# Patient Record
Sex: Female | Born: 1945 | Race: White | Hispanic: No | Marital: Married | Smoking: Never smoker
Health system: Southern US, Community
[De-identification: ages and names within clinical notes are randomized; demographics above are authoritative.]

## PROBLEM LIST (undated history)

## (undated) DIAGNOSIS — K219 Gastro-esophageal reflux disease without esophagitis: Secondary | ICD-10-CM

## (undated) HISTORY — DX: Gastro-esophageal reflux disease without esophagitis: K21.9

---

## 2014-08-19 ENCOUNTER — Encounter (INDEPENDENT_AMBULATORY_CARE_PROVIDER_SITE_OTHER): Payer: Self-pay | Admitting: *Deleted

## 2014-09-10 ENCOUNTER — Ambulatory Visit (INDEPENDENT_AMBULATORY_CARE_PROVIDER_SITE_OTHER): Payer: BLUE CROSS/BLUE SHIELD | Admitting: Internal Medicine

## 2014-09-10 ENCOUNTER — Encounter (INDEPENDENT_AMBULATORY_CARE_PROVIDER_SITE_OTHER): Payer: Self-pay | Admitting: Internal Medicine

## 2014-09-10 VITALS — BP 132/79 | HR 72 | Temp 98.4°F | Ht 66.0 in | Wt 201.4 lb

## 2014-09-10 DIAGNOSIS — K5732 Diverticulitis of large intestine without perforation or abscess without bleeding: Secondary | ICD-10-CM | POA: Diagnosis not present

## 2014-09-10 DIAGNOSIS — K219 Gastro-esophageal reflux disease without esophagitis: Secondary | ICD-10-CM

## 2014-09-10 LAB — CBC WITH DIFFERENTIAL/PLATELET
BASOS ABS: 0 10*3/uL (ref 0.0–0.1)
Basophils Relative: 0 % (ref 0–1)
Eosinophils Absolute: 0.1 10*3/uL (ref 0.0–0.7)
Eosinophils Relative: 2 % (ref 0–5)
HEMATOCRIT: 38.5 % (ref 36.0–46.0)
Hemoglobin: 12.5 g/dL (ref 12.0–15.0)
LYMPHS PCT: 31 % (ref 12–46)
Lymphs Abs: 1.8 10*3/uL (ref 0.7–4.0)
MCH: 26.4 pg (ref 26.0–34.0)
MCHC: 32.5 g/dL (ref 30.0–36.0)
MCV: 81.4 fL (ref 78.0–100.0)
MONO ABS: 0.4 10*3/uL (ref 0.1–1.0)
MPV: 11 fL (ref 8.6–12.4)
Monocytes Relative: 7 % (ref 3–12)
Neutro Abs: 3.5 10*3/uL (ref 1.7–7.7)
Neutrophils Relative %: 60 % (ref 43–77)
Platelets: 208 10*3/uL (ref 150–400)
RBC: 4.73 MIL/uL (ref 3.87–5.11)
RDW: 13.7 % (ref 11.5–15.5)
WBC: 5.9 10*3/uL (ref 4.0–10.5)

## 2014-09-10 LAB — HEPATIC FUNCTION PANEL
ALBUMIN: 3.9 g/dL (ref 3.5–5.2)
ALK PHOS: 91 U/L (ref 39–117)
ALT: 13 U/L (ref 0–35)
AST: 15 U/L (ref 0–37)
Bilirubin, Direct: 0.1 mg/dL (ref 0.0–0.3)
Indirect Bilirubin: 0.5 mg/dL (ref 0.2–1.2)
TOTAL PROTEIN: 7.4 g/dL (ref 6.0–8.3)
Total Bilirubin: 0.6 mg/dL (ref 0.2–1.2)

## 2014-09-10 NOTE — Patient Instructions (Signed)
Labs. OV in 1 yr. 

## 2014-09-10 NOTE — Progress Notes (Addendum)
   Subjective:    Patient ID: Valerie Buck, female    DOB: 05-27-1945, 69 y.o.   MRN: [id number]  HPI Referred to our office by Dr. Philipp Deputy of Internal Medicine Assoicates.Marland Kitchen  Hx of GERD.  She tells me she is doing okay. She sometimes on her left side it does not feel right.  She says she had a CT and was normal. She says she has had this odd feeling for over a year. Sometimes she will have the pain on movement. She rates the pain 3-4. The pain comes and goes.  Her appetite is good. She has not lost any weight.  She usually has a BM daily. Sometimes she will have 2-3 stools a day. Acid reflux controlled with the Nexium.    Last EGD in 2011 show mild gastritis.  Last colonoscopy in 2014 and she says was normal.  09/11/2013 Sigmoid Dr. West Carbo: Sigmoid diverticulosis. Erythema edema in lower sigmoid probably resolving diverticulitis but possibly recurrent ischemic bowel. Biopsied.  01/2014 ALT 118, AST 22, Direct bili 0.8, H and H 13.1 and 39.7 Review of Systems Married. One child with DJD, fibromyalgia.     Past Medical History  Diagnosis Date  . GERD (gastroesophageal reflux disease)     No past surgical history on file.  Allergies  Allergen Reactions  . Ivp Dye [Iodinated Diagnostic Agents]     No current outpatient prescriptions on file prior to visit.   No current facility-administered medications on file prior to visit.     Objective:   Physical Exam Blood pressure 132/79, pulse 72, temperature 98.4 F (36.9 C), height 5\' 6"  (1.676 m), weight 201 lb 6.4 oz (91.354 kg). Alert and oriented. Skin warm and dry. Oral mucosa is moist.   . Sclera anicteric, conjunctivae is pink. Thyroid not enlarged. No cervical lymphadenopathy. Lungs clear. Heart regular rate and rhythm.  Abdomen is soft. Bowel sounds are positive. No hepatomegaly. No abdominal masses felt. No tenderness.  No edema to lower extremities.          Assessment & Plan:  GERD controlled at this time. LUQ  tenderness for over a year. CT scan per patient was normal. Possibly MS in nature. OV in 1 yr. CBC, Hepatic function today. Will CT report and last colonoscopy from her GI office.

## 2014-09-11 ENCOUNTER — Encounter (INDEPENDENT_AMBULATORY_CARE_PROVIDER_SITE_OTHER): Payer: Self-pay

## 2014-09-16 ENCOUNTER — Encounter (INDEPENDENT_AMBULATORY_CARE_PROVIDER_SITE_OTHER): Payer: Self-pay

## 2015-05-21 ENCOUNTER — Encounter (INDEPENDENT_AMBULATORY_CARE_PROVIDER_SITE_OTHER): Payer: Self-pay | Admitting: Internal Medicine

## 2015-09-10 ENCOUNTER — Ambulatory Visit (INDEPENDENT_AMBULATORY_CARE_PROVIDER_SITE_OTHER): Payer: BLUE CROSS/BLUE SHIELD | Admitting: Internal Medicine

## 2018-11-28 ENCOUNTER — Other Ambulatory Visit: Payer: Self-pay

## 2018-11-28 ENCOUNTER — Encounter (HOSPITAL_COMMUNITY): Payer: Self-pay | Admitting: Emergency Medicine

## 2018-11-28 ENCOUNTER — Emergency Department (HOSPITAL_COMMUNITY)
Admission: EM | Admit: 2018-11-28 | Discharge: 2018-11-29 | Disposition: A | Discharge status: Home / Self Care | Payer: BC Managed Care – PPO | Attending: Emergency Medicine | Admitting: Emergency Medicine

## 2018-11-28 ENCOUNTER — Emergency Department (HOSPITAL_COMMUNITY): Payer: BC Managed Care – PPO

## 2018-11-28 DIAGNOSIS — Z79899 Other long term (current) drug therapy: Secondary | ICD-10-CM | POA: Diagnosis not present

## 2018-11-28 DIAGNOSIS — K625 Hemorrhage of anus and rectum: Secondary | ICD-10-CM | POA: Diagnosis present

## 2018-11-28 DIAGNOSIS — K529 Noninfective gastroenteritis and colitis, unspecified: Secondary | ICD-10-CM | POA: Diagnosis not present

## 2018-11-28 LAB — CBC WITH DIFFERENTIAL/PLATELET
Abs Immature Granulocytes: 0.05 10*3/uL (ref 0.00–0.07)
Basophils Absolute: 0 10*3/uL (ref 0.0–0.1)
Basophils Relative: 0 %
Eosinophils Absolute: 0.2 10*3/uL (ref 0.0–0.5)
Eosinophils Relative: 1 %
HCT: 43.2 % (ref 36.0–46.0)
Hemoglobin: 13.3 g/dL (ref 12.0–15.0)
Immature Granulocytes: 0 %
Lymphocytes Relative: 20 %
Lymphs Abs: 2.7 10*3/uL (ref 0.7–4.0)
MCH: 26.9 pg (ref 26.0–34.0)
MCHC: 30.8 g/dL (ref 30.0–36.0)
MCV: 87.4 fL (ref 80.0–100.0)
Monocytes Absolute: 0.9 10*3/uL (ref 0.1–1.0)
Monocytes Relative: 7 %
Neutro Abs: 9.7 10*3/uL — ABNORMAL HIGH (ref 1.7–7.7)
Neutrophils Relative %: 72 %
Platelets: 211 10*3/uL (ref 150–400)
RBC: 4.94 MIL/uL (ref 3.87–5.11)
RDW: 13.3 % (ref 11.5–15.5)
WBC: 13.6 10*3/uL — ABNORMAL HIGH (ref 4.0–10.5)
nRBC: 0 % (ref 0.0–0.2)

## 2018-11-28 LAB — COMPREHENSIVE METABOLIC PANEL
ALT: 21 U/L (ref 0–44)
AST: 17 U/L (ref 15–41)
Albumin: 3.6 g/dL (ref 3.5–5.0)
Alkaline Phosphatase: 98 U/L (ref 38–126)
Anion gap: 7 (ref 5–15)
BUN: 12 mg/dL (ref 8–23)
CO2: 24 mmol/L (ref 22–32)
Calcium: 8.6 mg/dL — ABNORMAL LOW (ref 8.9–10.3)
Chloride: 107 mmol/L (ref 98–111)
Creatinine, Ser: 1.06 mg/dL — ABNORMAL HIGH (ref 0.44–1.00)
GFR calc Af Amer: >60 mL/min (ref >60)
GFR calc non Af Amer: 52 mL/min — ABNORMAL LOW (ref >60)
Glucose, Bld: 162 mg/dL — ABNORMAL HIGH (ref 70–99)
Potassium: 3.8 mmol/L (ref 3.5–5.1)
Sodium: 138 mmol/L (ref 135–145)
Total Bilirubin: 0.6 mg/dL (ref 0.3–1.2)
Total Protein: 7.4 g/dL (ref 6.5–8.1)

## 2018-11-28 LAB — TYPE AND SCREEN
ABO/RH(D): O POS
Antibody Screen: NEGATIVE

## 2018-11-28 LAB — POC OCCULT BLOOD, ED: Fecal Occult Bld: POSITIVE — AB

## 2018-11-28 MED ORDER — SODIUM CHLORIDE 0.9 % IV BOLUS
1000.0000 mL | Freq: Once | INTRAVENOUS | Status: AC
  Administered 2018-11-28: 21:00:00 1000 mL via INTRAVENOUS

## 2018-11-28 MED ORDER — DICYCLOMINE HCL 20 MG PO TABS: ORAL_TABLET | ORAL | 0 refills | Status: DC

## 2018-11-28 MED ORDER — METRONIDAZOLE 500 MG PO TABS: 500.0000 mg | ORAL_TABLET | Freq: Three times a day (TID) | ORAL | 0 refills | Status: DC

## 2018-11-28 MED ORDER — CIPROFLOXACIN HCL 500 MG PO TABS: ORAL_TABLET | ORAL | 0 refills | Status: DC

## 2018-11-28 MED ORDER — HYDROMORPHONE HCL 1 MG/ML IJ SOLN
1.0000 mg | Freq: Once | INTRAMUSCULAR | Status: AC
  Administered 2018-11-28: 21:00:00 1 mg via INTRAVENOUS
  Filled 2018-11-28: qty 1

## 2018-11-28 MED ORDER — CIPROFLOXACIN IN D5W 400 MG/200ML IV SOLN
400.0000 mg | Freq: Once | INTRAVENOUS | Status: AC
  Administered 2018-11-28: 400 mg via INTRAVENOUS
  Filled 2018-11-28: qty 200

## 2018-11-28 MED ORDER — METRONIDAZOLE IN NACL 5-0.79 MG/ML-% IV SOLN
500.0000 mg | Freq: Once | INTRAVENOUS | Status: AC
  Administered 2018-11-28: 500 mg via INTRAVENOUS
  Filled 2018-11-28: qty 100

## 2018-11-28 NOTE — Discharge Instructions (Addendum)
Follow-up with Dr. Laural Golden next week.  Return if bleeding gets severe

## 2018-11-28 NOTE — ED Triage Notes (Signed)
Pt states that she started having diarrhea last night then it went to bright red blood.

## 2018-11-28 NOTE — ED Provider Notes (Signed)
[facility] EMERGENCY DEPARTMENT Provider Note   CSN: [id number] Arrival date & time: 11/28/18  1733     History   Chief Complaint Chief Complaint  Patient presents with  . Rectal Bleeding    HPI Valerie Buck is a 73 y.o. female.     Patient complains of abdominal pain and rectal bleeding.  For the last 2 days.  Initially she had diarrhea without blood  The history is provided by the patient. No language interpreter was used.  Rectal Bleeding Quality:  Bright red Amount:  Moderate Timing:  Intermittent Chronicity:  New Context: not anal fissures   Similar prior episodes: no   Relieved by:  Nothing Worsened by:  Nothing Ineffective treatments:  None tried Associated symptoms: abdominal pain     Past Medical History:  Diagnosis Date  . GERD (gastroesophageal reflux disease)     Patient Active Problem List   Diagnosis Date Noted  . GERD (gastroesophageal reflux disease) 09/10/2014  . Diverticulitis of colon 09/10/2014    History reviewed. No pertinent surgical history.   OB History   No obstetric history on file.      Home Medications    Prior to Admission medications   Medication Sig Start Date End Date Taking? Authorizing Provider  diclofenac (VOLTAREN) 75 MG EC tablet Take 75 mg by mouth 2 (two) times daily with a meal. 11/15/18  Yes [provider]  Eluxadoline 100 MG TABS Take 100 mg by mouth daily.    Yes [provider]  esomeprazole (NEXIUM) 20 MG capsule Take 20 mg by mouth daily at 12 noon.   Yes [provider]  sertraline (ZOLOFT) 100 MG tablet Take 100 mg by mouth daily.   Yes [provider]  ciprofloxacin (CIPRO) 500 MG tablet One po bid 11/28/18   Milton Ferguson, MD  dicyclomine (BENTYL) 20 MG tablet Take one every 6 hours if needed for abdominal cramps 11/28/18   Milton Ferguson, MD  metroNIDAZOLE (FLAGYL) 500 MG tablet Take 1 tablet (500 mg total) by mouth 3 (three) times daily. One po bid x 7 days 11/28/18    Milton Ferguson, MD    Family History No family history on file.  Social History Social History   Tobacco Use  . Smoking status: Never Smoker  . Smokeless tobacco: Never Used  Substance Use Topics  . Alcohol use: No    Alcohol/week: 0.0 standard drinks  . Drug use: No     Allergies   Ivp dye [iodinated diagnostic agents]   Review of Systems Review of Systems  Constitutional: Negative for appetite change and fatigue.  HENT: Negative for congestion, ear discharge and sinus pressure.   Eyes: Negative for discharge.  Respiratory: Negative for cough.   Cardiovascular: Negative for chest pain.  Gastrointestinal: Positive for abdominal pain and hematochezia. Negative for diarrhea.       Rectal bleeding  Genitourinary: Negative for frequency and hematuria.  Musculoskeletal: Negative for back pain.  Skin: Negative for rash.  Neurological: Negative for seizures and headaches.  Psychiatric/Behavioral: Negative for hallucinations.     Physical Exam Updated Vital Signs BP 120/71   Pulse 77   Temp 98.5 F (36.9 C) (Oral)   Resp 18   Ht 5\' 5"  (1.651 m)   Wt 79.4 kg   SpO2 97%   BMI 29.12 kg/m   Physical Exam Vitals signs and nursing note reviewed.  Constitutional:      Appearance: She is well-developed.  HENT:     Head:  Normocephalic.     Nose: Nose normal.  Eyes:     General: No scleral icterus.    Conjunctiva/sclera: Conjunctivae normal.  Neck:     Musculoskeletal: Neck supple.     Thyroid: No thyromegaly.  Cardiovascular:     Rate and Rhythm: Normal rate and regular rhythm.     Heart sounds: No murmur. No friction rub. No gallop.   Pulmonary:     Breath sounds: No stridor. No wheezing or rales.  Chest:     Chest wall: No tenderness.  Abdominal:     General: There is no distension.     Tenderness: There is abdominal tenderness. There is no rebound.  Musculoskeletal: Normal range of motion.  Lymphadenopathy:     Cervical: No cervical adenopathy.  Skin:     Findings: No erythema or rash.  Neurological:     Mental Status: She is oriented to person, place, and time.     Motor: No abnormal muscle tone.     Coordination: Coordination normal.  Psychiatric:        Behavior: Behavior normal.      ED Treatments / Results  Labs (all labs ordered are listed, but only abnormal results are displayed) Labs Reviewed  COMPREHENSIVE METABOLIC PANEL - Abnormal; Notable for the following components:      Result Value   Glucose, Bld 162 (*)    Creatinine, Ser 1.06 (*)    Calcium 8.6 (*)    GFR calc non Af Amer 52 (*)    All other components within normal limits  CBC WITH DIFFERENTIAL/PLATELET - Abnormal; Notable for the following components:   WBC 13.6 (*)    Neutro Abs 9.7 (*)    All other components within normal limits  POC OCCULT BLOOD, ED - Abnormal; Notable for the following components:   Fecal Occult Bld POSITIVE (*)    All other components within normal limits  TYPE AND SCREEN    EKG None  Radiology Ct Abdomen Pelvis Wo Contrast  Result Date: 11/28/2018 CLINICAL DATA:  Diarrhea and bright red blood per rectum EXAM: CT ABDOMEN AND PELVIS WITHOUT CONTRAST TECHNIQUE: Multidetector CT imaging of the abdomen and pelvis was performed following the standard protocol without IV contrast. COMPARISON:  None. FINDINGS: LOWER CHEST: 3 mm right lower lobe pulmonary nodule. HEPATOBILIARY: The hepatic contours and density are normal. There is no intra- or extrahepatic biliary dilatation. The gallbladder is normal. PANCREAS: The pancreatic parenchymal contours are normal and there is no ductal dilatation. There is no peripancreatic fluid collection. SPLEEN: Normal. ADRENALS/URINARY TRACT: --Adrenal glands: There is a left adrenal lesion measuring 16 mm with an attenuation of 10 HU. --Right kidney/ureter: No hydronephrosis, nephroureterolithiasis, perinephric stranding or solid renal mass. --Left kidney/ureter: No hydronephrosis, nephroureterolithiasis,  perinephric stranding or solid renal mass. --Urinary bladder: Normal for degree of distention STOMACH/BOWEL: --Stomach/Duodenum: There is wall thickening and adjacent inflammatory stranding of the descending colon, beginning at the splenic flexure. No free intraperitoneal air or fluid collection. The remainder of the colon is normal. --Small bowel: No dilatation or inflammation. --Colon: No focal abnormality. --Appendix: Normal. VASCULAR/LYMPHATIC: Normal course and caliber of the major abdominal vessels. No abdominal or pelvic lymphadenopathy. REPRODUCTIVE: Normal uterus and ovaries. MUSCULOSKELETAL. No bony spinal canal stenosis or focal osseous abnormality. OTHER: None. IMPRESSION: 1. Acute colitis of the descending colon without free intraperitoneal air or intraperitoneal fluid collection. 2. Left adrenal adenoma. Electronically Signed   By: Ulyses Jarred M.D.   On: 11/28/2018 21:40    Procedures  Procedures (including critical care time)  Medications Ordered in ED Medications  ciprofloxacin (CIPRO) IVPB 400 mg (400 mg Intravenous New Bag/Given 11/28/18 2204)  metroNIDAZOLE (FLAGYL) IVPB 500 mg (has no administration in time range)  sodium chloride 0.9 % bolus 1,000 mL (1,000 mLs Intravenous New Bag/Given 11/28/18 2115)  HYDROmorphone (DILAUDID) injection 1 mg (1 mg Intravenous Given 11/28/18 2116)     Initial Impression / Assessment and Plan / ED Course  I have reviewed the triage vital signs and the nursing notes.  Pertinent labs & imaging results that were available during my care of the patient were reviewed by me and considered in my medical decision making (see chart for details).        Labs unremarkable except elevated white count.  CT scan shows colitis.  Patient is nontoxic.  Patient would rather be taking care of as an outpatient.  She will be sent home on Cipro Flagyl and Bentyl and follow-up with GI Final Clinical Impressions(s) / ED Diagnoses   Final diagnoses:  Colitis     ED Discharge Orders         Ordered    ciprofloxacin (CIPRO) 500 MG tablet     11/28/18 2241    metroNIDAZOLE (FLAGYL) 500 MG tablet  3 times daily     11/28/18 2241    dicyclomine (BENTYL) 20 MG tablet     11/28/18 2241           Milton Ferguson, MD 11/28/18 2244

## 2018-12-12 ENCOUNTER — Ambulatory Visit (INDEPENDENT_AMBULATORY_CARE_PROVIDER_SITE_OTHER): Payer: BC Managed Care – PPO | Admitting: Nurse Practitioner

## 2018-12-12 ENCOUNTER — Other Ambulatory Visit: Payer: Self-pay

## 2018-12-12 ENCOUNTER — Encounter (INDEPENDENT_AMBULATORY_CARE_PROVIDER_SITE_OTHER): Payer: Self-pay | Admitting: *Deleted

## 2018-12-12 ENCOUNTER — Encounter (INDEPENDENT_AMBULATORY_CARE_PROVIDER_SITE_OTHER): Payer: Self-pay | Admitting: Nurse Practitioner

## 2018-12-12 ENCOUNTER — Telehealth (INDEPENDENT_AMBULATORY_CARE_PROVIDER_SITE_OTHER): Payer: Self-pay | Admitting: *Deleted

## 2018-12-12 VITALS — BP 145/76 | HR 73 | Temp 98.0°F | Ht 65.0 in | Wt 199.8 lb

## 2018-12-12 DIAGNOSIS — K529 Noninfective gastroenteritis and colitis, unspecified: Secondary | ICD-10-CM

## 2018-12-12 DIAGNOSIS — D3502 Benign neoplasm of left adrenal gland: Secondary | ICD-10-CM

## 2018-12-12 MED ORDER — PLENVU 140 G PO SOLR: 1.0000 | Freq: Once | ORAL | 0 refills | Status: AC

## 2018-12-12 NOTE — Progress Notes (Addendum)
Subjective:    Patient ID: Valerie Buck, female    DOB: 13-Jan-1946, 73 y.o.   MRN: [id number]  Patient's symptom complex typical of ischemic colitis. She is to undergo colonoscopy in near future.   Valerie Buck is a 73 y.o. female with a past medical history of depression, GERD, diverticulitis 2015, fibromyalgia and an adrenal nodule. She developed generalized abdominal pain on Tues night 11/27/2018. Her abdominal pain lasted for a 2 - 3 hours. She sat on the commode for an extended period of time, developed mud diarrhea then watery diarrhea. By mid morning, she started to pass bloody diarrhea. She presented to her PCP's office the next day, she had a fever so she was sent directly to [facility] ED for further evaluation.  Her WBC was 13.6. An abdominal/pelvic CT showed acute colitis to the descending colon without evidence of an abscess or perforation. A left adrenal adenoma was noted (known history of adrenal adenoma followed by endocrinology). She was prescribed Cipro and Flagyl for presumed infectious colitis and she was discharged home. Since she returned home, she denies having any further abdominal pain pain but feels like something is not right inside. She is passing mud to somewhat watery stools 1 to 3, no further blood diarrhea. No fever. She is eating a regular diet. She reported having a colonoscopy in 2014 by  Dr. Algis Greenhouse which she reported was normal. She underwent a flexible sigmoidoscopy by Dr. West Carbo 09/11/2014 due to having abdominal pain which identified erythema edema in lower sigmoid probably resolving diverticulitis but possibly recurrent ischemic bowel.Biopsies showed polypoid chronic nonspecific colitis. She reported having an EGD x 2, the most recent one was in 2011 which showed mild gastritis. She denies having any current reflux symptoms.She takes Nexium 40mg  once daily. She takes Diclofenac 75mg  bid for fibromyalgia pain.   Past Medical History:  Diagnosis Date  . GERD  (gastroesophageal reflux disease)    No past surgical history on file. Current Outpatient Medications on File Prior to Visit  Medication Sig Dispense Refill  . diclofenac (VOLTAREN) 75 MG EC tablet Take 75 mg by mouth 2 (two) times daily.    Marland Kitchen esomeprazole (NEXIUM) 20 MG capsule Take 20 mg by mouth daily at 12 noon.    . metroNIDAZOLE (FLAGYL) 500 MG tablet Take 1 tablet (500 mg total) by mouth 3 (three) times daily. One po bid x 7 days 30 tablet 0  . sertraline (ZOLOFT) 100 MG tablet Take 100 mg by mouth daily.     No current facility-administered medications on file prior to visit.    Allergies  Allergen Reactions  . Ivp Dye [Iodinated Diagnostic Agents] Other (See Comments)    Many side effects, not including nausea/vomiting   No family history of colorectal cancer  Social History: she is married, no alcohol or tobacco use  Review of Systems  See HPI, all other systems reviewed and are negative      Objective:   Physical Exam  Blood pressure (!) 145/76, pulse 73, temperature 98 F (36.7 C), height 5\' 5"  (1.651 m), weight 199 lb 12.8 oz (90.6 kg).  General: 73 year old female well developed in NAD Eyes: sclera nonicteric, conjunctiva pink Mouth: no ulcers or lesions  Neck: supple, no thyromegaly or lymphadenopathy Heart: RRR, no murmur Lungs: clear throughout Abdomen: soft, nontender, + BS x 4 quads, no HSM Extremities: no edema Neuro: alert and oriented x 4, no focal deficits     Assessment & Plan:   1. 73  y.o. female with recent infectious verses ischemic colitis -CBC, CMP and CRP -schedule a colonoscopy, benefits and risks discussed with patient -Phillip's bacteria probiotic once daily -further follow up to be determined after colonoscopy completed, if colonoscopy negative to consider abdominal CT angiogram or mesenteric doppler for ischemic colitis evaluation -patient to call our office if abdominal pain or bloody diarrhea recurs   2. GERD, asymptomatic on  Nexium 40mg  daily  3. Left adrenal adenoma followed by endocrinology

## 2018-12-12 NOTE — Patient Instructions (Signed)
1.  Complete the provided lab order today  2.  Schedule a colonoscopy  3.  Phillips bacteria probiotic 1 capsule once daily 4.  Continue Nexium 40 mg once daily  5.  Call our office if your abdominal pain or bloody diarrhea recurs  6.  Further follow-up due to be determined after the above evaluation completed

## 2018-12-12 NOTE — Telephone Encounter (Signed)
Patient needs plenvu TCS sch'd 10/7

## 2018-12-13 DIAGNOSIS — F32A Depression, unspecified: Secondary | ICD-10-CM | POA: Insufficient documentation

## 2018-12-13 DIAGNOSIS — D3502 Benign neoplasm of left adrenal gland: Secondary | ICD-10-CM | POA: Insufficient documentation

## 2018-12-13 DIAGNOSIS — F329 Major depressive disorder, single episode, unspecified: Secondary | ICD-10-CM | POA: Insufficient documentation

## 2018-12-13 LAB — BASIC METABOLIC PANEL WITH GFR
BUN: 7 mg/dL (ref 7–25)
CO2: 29 mmol/L (ref 20–32)
Calcium: 8.8 mg/dL (ref 8.6–10.4)
Chloride: 108 mmol/L (ref 98–110)
Creat: 0.72 mg/dL (ref 0.60–0.93)
GFR, Est African American: 96 mL/min/{1.73_m2} (ref >=60)
GFR, Est Non African American: 83 mL/min/{1.73_m2} (ref >=60)
Glucose, Bld: 90 mg/dL (ref 65–139)
Potassium: 3.4 mmol/L — ABNORMAL LOW (ref 3.5–5.3)
Sodium: 144 mmol/L (ref 135–146)

## 2018-12-13 LAB — C-REACTIVE PROTEIN: CRP: 2 mg/L (ref <8.0)

## 2018-12-13 LAB — CBC WITH DIFFERENTIAL/PLATELET
Absolute Monocytes: 319 cells/uL (ref 200–950)
Basophils Absolute: 28 cells/uL (ref 0–200)
Basophils Relative: 0.5 %
Eosinophils Absolute: 190 cells/uL (ref 15–500)
Eosinophils Relative: 3.4 %
HCT: 36.7 % (ref 35.0–45.0)
Hemoglobin: 12 g/dL (ref 11.7–15.5)
Lymphs Abs: 1630 cells/uL (ref 850–3900)
MCH: 27.8 pg (ref 27.0–33.0)
MCHC: 32.7 g/dL (ref 32.0–36.0)
MCV: 85 fL (ref 80.0–100.0)
MPV: 11.4 fL (ref 7.5–12.5)
Monocytes Relative: 5.7 %
Neutro Abs: 3433 cells/uL (ref 1500–7800)
Neutrophils Relative %: 61.3 %
Platelets: 213 10*3/uL (ref 140–400)
RBC: 4.32 10*6/uL (ref 3.80–5.10)
RDW: 13 % (ref 11.0–15.0)
Total Lymphocyte: 29.1 %
WBC: 5.6 10*3/uL (ref 3.8–10.8)

## 2018-12-18 DIAGNOSIS — K529 Noninfective gastroenteritis and colitis, unspecified: Secondary | ICD-10-CM | POA: Insufficient documentation

## 2019-01-28 ENCOUNTER — Other Ambulatory Visit (HOSPITAL_COMMUNITY)
Admission: RE | Admit: 2019-01-28 | Discharge: 2019-01-28 | Disposition: A | Discharge status: Home / Self Care | Payer: BC Managed Care – PPO | Source: Ambulatory Visit | Attending: Internal Medicine | Admitting: Internal Medicine

## 2019-01-28 ENCOUNTER — Other Ambulatory Visit: Payer: Self-pay

## 2019-01-28 DIAGNOSIS — Z01812 Encounter for preprocedural laboratory examination: Secondary | ICD-10-CM | POA: Diagnosis not present

## 2019-01-28 DIAGNOSIS — Z20828 Contact with and (suspected) exposure to other viral communicable diseases: Secondary | ICD-10-CM | POA: Diagnosis not present

## 2019-01-28 LAB — SARS CORONAVIRUS 2 (TAT 6-24 HRS): SARS Coronavirus 2: NEGATIVE

## 2019-01-30 ENCOUNTER — Other Ambulatory Visit: Payer: Self-pay

## 2019-01-30 ENCOUNTER — Encounter (HOSPITAL_COMMUNITY)
Admission: RE | Disposition: A | Discharge status: Home / Self Care | Payer: Self-pay | Source: Home / Self Care | Attending: Internal Medicine

## 2019-01-30 ENCOUNTER — Ambulatory Visit (HOSPITAL_COMMUNITY)
Admission: RE | Admit: 2019-01-30 | Discharge: 2019-01-30 | Disposition: A | Discharge status: Home / Self Care | Payer: BC Managed Care – PPO | Attending: Internal Medicine | Admitting: Internal Medicine

## 2019-01-30 ENCOUNTER — Encounter (HOSPITAL_COMMUNITY): Payer: Self-pay | Admitting: *Deleted

## 2019-01-30 DIAGNOSIS — R933 Abnormal findings on diagnostic imaging of other parts of digestive tract: Secondary | ICD-10-CM | POA: Diagnosis not present

## 2019-01-30 DIAGNOSIS — Z79899 Other long term (current) drug therapy: Secondary | ICD-10-CM | POA: Insufficient documentation

## 2019-01-30 DIAGNOSIS — K5730 Diverticulosis of large intestine without perforation or abscess without bleeding: Secondary | ICD-10-CM

## 2019-01-30 DIAGNOSIS — F419 Anxiety disorder, unspecified: Secondary | ICD-10-CM | POA: Diagnosis not present

## 2019-01-30 DIAGNOSIS — K529 Noninfective gastroenteritis and colitis, unspecified: Secondary | ICD-10-CM

## 2019-01-30 DIAGNOSIS — K219 Gastro-esophageal reflux disease without esophagitis: Secondary | ICD-10-CM | POA: Diagnosis not present

## 2019-01-30 DIAGNOSIS — K6289 Other specified diseases of anus and rectum: Secondary | ICD-10-CM | POA: Diagnosis not present

## 2019-01-30 DIAGNOSIS — F329 Major depressive disorder, single episode, unspecified: Secondary | ICD-10-CM | POA: Insufficient documentation

## 2019-01-30 DIAGNOSIS — K644 Residual hemorrhoidal skin tags: Secondary | ICD-10-CM | POA: Diagnosis not present

## 2019-01-30 DIAGNOSIS — D123 Benign neoplasm of transverse colon: Secondary | ICD-10-CM | POA: Insufficient documentation

## 2019-01-30 HISTORY — PX: COLONOSCOPY: SHX5424

## 2019-01-30 HISTORY — PX: POLYPECTOMY: SHX5525

## 2019-01-30 SURGERY — COLONOSCOPY
Anesthesia: Moderate Sedation

## 2019-01-30 MED ORDER — SODIUM CHLORIDE 0.9 % IV SOLN
INTRAVENOUS | Status: DC
  Administered 2019-01-30: 11:00:00 via INTRAVENOUS

## 2019-01-30 MED ORDER — MIDAZOLAM HCL 5 MG/5ML IJ SOLN
INTRAMUSCULAR | Status: AC
  Filled 2019-01-30: qty 10

## 2019-01-30 MED ORDER — MEPERIDINE HCL 50 MG/ML IJ SOLN
INTRAMUSCULAR | Status: AC
  Filled 2019-01-30: qty 1

## 2019-01-30 MED ORDER — MEPERIDINE HCL 50 MG/ML IJ SOLN
INTRAMUSCULAR | Status: DC | PRN
  Administered 2019-01-30 (×2): 25 mg via INTRAVENOUS

## 2019-01-30 MED ORDER — STERILE WATER FOR IRRIGATION IR SOLN
Status: DC | PRN
  Administered 2019-01-30: 12:00:00 2.5 mL

## 2019-01-30 MED ORDER — MIDAZOLAM HCL 5 MG/5ML IJ SOLN
INTRAMUSCULAR | Status: DC | PRN
  Administered 2019-01-30: 1 mg via INTRAVENOUS
  Administered 2019-01-30 (×4): 2 mg via INTRAVENOUS
  Administered 2019-01-30: 1 mg via INTRAVENOUS

## 2019-01-30 NOTE — Discharge Instructions (Signed)
No aspirin or NSAIDs for 24 hours. Resume other medications as before. High-fiber diet. No driving for 24 hours. Physician will call with biopsy results.      Colonoscopy, Adult, Care After This sheet gives you information about how to care for yourself after your procedure. Your doctor may also give you more specific instructions. If you have problems or questions, call your doctor. What can I expect after the procedure? After the procedure, it is common to have:  A small amount of blood in your poop for 24 hours.  Some gas.  Mild cramping or bloating in your belly. Follow these instructions at home: General instructions  For the first 24 hours after the procedure: ? Do not drive or use machinery. ? Do not sign important documents. ? Do not drink alcohol. ? Do your daily activities more slowly than normal. ? Eat foods that are soft and easy to digest.  Take over-the-counter or prescription medicines only as told by your doctor. To help cramping and bloating:   Try walking around.  Put heat on your belly (abdomen) as told by your doctor. Use a heat source that your doctor recommends, such as a moist heat pack or a heating pad. ? Put a towel between your skin and the heat source. ? Leave the heat on for 20-30 minutes. ? Remove the heat if your skin turns bright red. This is especially important if you cannot feel pain, heat, or cold. You can get burned. Eating and drinking   Drink enough fluid to keep your pee (urine) clear or pale yellow.  Return to your normal diet as told by your doctor. Avoid heavy or fried foods that are hard to digest.  Avoid drinking alcohol for as long as told by your doctor. Contact a doctor if:  You have blood in your poop (stool) 2-3 days after the procedure. Get help right away if:  You have more than a small amount of blood in your poop.  You see large clumps of tissue (blood clots) in your poop.  Your belly is swollen.  You feel  sick to your stomach (nauseous).  You throw up (vomit).  You have a fever.  You have belly pain that gets worse, and medicine does not help your pain. Summary  After the procedure, it is common to have a small amount of blood in your poop. You may also have mild cramping and bloating in your belly.  For the first 24 hours after the procedure, do not drive or use machinery, do not sign important documents, and do not drink alcohol.  Get help right away if you have a lot of blood in your poop, feel sick to your stomach, have a fever, or have more belly pain. This information is not intended to replace advice given to you by your health care provider. Make sure you discuss any questions you have with your health care provider. Document Released: 05/14/2010 Document Revised: 02/09/2017 Document Reviewed: 01/04/2016 Elsevier Patient Education  [place].     High-Fiber Diet Fiber, also called dietary fiber, is a type of carbohydrate that is found in fruits, vegetables, whole grains, and beans. A high-fiber diet can have many health benefits. Your health care provider may recommend a high-fiber diet to help:  Prevent constipation. Fiber can make your bowel movements more regular.  Lower your cholesterol.  Relieve the following conditions: ? Swelling of veins in the anus (hemorrhoids). ? Swelling and irritation (inflammation) of specific areas of the  digestive tract (uncomplicated diverticulosis). ? A problem of the large intestine (colon) that sometimes causes pain and diarrhea (irritable bowel syndrome, IBS).  Prevent overeating as part of a weight-loss plan.  Prevent heart disease, type 2 diabetes, and certain cancers. What is my plan? The recommended daily fiber intake in grams (g) includes:  38 g for men age 75 or younger.  30 g for men over age 77.  20 g for women age 70 or younger.  21 g for women over age 59. You can get the recommended daily intake of dietary  fiber by:  Eating a variety of fruits, vegetables, grains, and beans.  Taking a fiber supplement, if it is not possible to get enough fiber through your diet. What do I need to know about a high-fiber diet?  It is better to get fiber through food sources rather than from fiber supplements. There is not a lot of research about how effective supplements are.  Always check the fiber content on the nutrition facts label of any prepackaged food. Look for foods that contain 5 g of fiber or more per serving.  Talk with a diet and nutrition specialist (dietitian) if you have questions about specific foods that are recommended or not recommended for your medical condition, especially if those foods are not listed below.  Gradually increase how much fiber you consume. If you increase your intake of dietary fiber too quickly, you may have bloating, cramping, or gas.  Drink plenty of water. Water helps you to digest fiber. What are tips for following this plan?  Eat a wide variety of high-fiber foods.  Make sure that half of the grains that you eat each day are whole grains.  Eat breads and cereals that are made with whole-grain flour instead of refined flour or white flour.  Eat brown rice, bulgur wheat, or millet instead of white rice.  Start the day with a breakfast that is high in fiber, such as a cereal that contains 5 g of fiber or more per serving.  Use beans in place of meat in soups, salads, and pasta dishes.  Eat high-fiber snacks, such as berries, raw vegetables, nuts, and popcorn.  Choose whole fruits and vegetables instead of processed forms like juice or sauce. What foods can I eat?  Fruits Berries. Pears. Apples. Oranges. Avocado. Prunes and raisins. Dried figs. Vegetables Sweet potatoes. Spinach. Kale. Artichokes. Cabbage. Broccoli. Cauliflower. Green peas. Carrots. Squash. Grains Whole-grain breads. Multigrain cereal. Oats and oatmeal. Brown rice. Barley. Bulgur wheat.  [place]. Quinoa. Bran muffins. Popcorn. Rye wafer crackers. Meats and other proteins Navy, kidney, and pinto beans. Soybeans. Split peas. Lentils. Nuts and seeds. Dairy Fiber-fortified yogurt. Beverages Fiber-fortified soy milk. Fiber-fortified orange juice. Other foods Fiber bars. The items listed above may not be a complete list of recommended foods and beverages. Contact a dietitian for more options. What foods are not recommended? Fruits Fruit juice. Cooked, strained fruit. Vegetables Fried potatoes. Canned vegetables. Well-cooked vegetables. Grains White bread. Pasta made with refined flour. White rice. Meats and other proteins Fatty cuts of meat. Fried chicken or fried fish. Dairy Milk. Yogurt. Cream cheese. Sour cream. Fats and oils Butters. Beverages Soft drinks. Other foods Cakes and pastries. The items listed above may not be a complete list of foods and beverages to avoid. Contact a dietitian for more information. Summary  Fiber is a type of carbohydrate. It is found in fruits, vegetables, whole grains, and beans.  There are many health benefits of eating a  high-fiber diet, such as preventing constipation, lowering blood cholesterol, helping with weight loss, and reducing your risk of heart disease, diabetes, and certain cancers.  Gradually increase your intake of fiber. Increasing too fast can result in cramping, bloating, and gas. Drink plenty of water while you increase your fiber.  The best sources of fiber include whole fruits and vegetables, whole grains, nuts, seeds, and beans. This information is not intended to replace advice given to you by your health care provider. Make sure you discuss any questions you have with your health care provider. Document Released: 04/11/2005 Document Revised: 02/13/2017 Document Reviewed: 02/13/2017 Elsevier Patient Education  2020 [employer].

## 2019-01-30 NOTE — H&P (Signed)
Valerie Buck is an 73 y.o. female.   Chief Complaint: Patient is here for colonoscopy. HPI: Patient is 73 year old Caucasian female who has a history of IBS and is on Viberzi who presented emergency room on 12/12/2018 with rectal bleeding and left-sided abdominal pain.  CT revealed left-sided colitis suspicious for ischemic colitis.  She was empirically treated with antibiotics.  She says she has not had any more rectal bleeding but she still has intermittent left-sided pain.  Her appetite is good and weight is stable.  Last full colonoscopy was in 2014.  She did have a sigmoidoscopy in 2015 and felt to have infection or ischemic colitis.  The studies were performed by Dr. West Carbo office [place]. Patient takes ibuprofen on as-needed basis which is not often. Family history is negative for CRC.  Past Medical History:  Diagnosis Date  . GERD (gastroesophageal reflux disease)        Depression and anxiety  History reviewed. No pertinent surgical history.  History reviewed. No pertinent family history. Social History:  reports that she has never smoked. She has never used smokeless tobacco. She reports that she does not drink alcohol or use drugs.  Allergies:  Allergies  Allergen Reactions  . Ivp Dye [Iodinated Diagnostic Agents] Other (See Comments)    Turned red, ran hot  . Shellfish Allergy     Avoids due to dye allergy     Medications Prior to Admission  Medication Sig Dispense Refill  . Eluxadoline (VIBERZI) 75 MG TABS Take 75 mg by mouth daily.    Marland Kitchen esomeprazole (NEXIUM) 20 MG capsule Take 20 mg by mouth 2 (two) times daily.     Marland Kitchen ibuprofen (ADVIL) 200 MG tablet Take 400 mg by mouth every 6 (six) hours as needed for headache or moderate pain.    . predniSONE (DELTASONE) 5 MG tablet Take 5 mg by mouth daily with breakfast.    . sertraline (ZOLOFT) 100 MG tablet Take 100 mg by mouth daily.    . metroNIDAZOLE (FLAGYL) 500 MG tablet Take 1 tablet (500 mg total) by mouth 3  (three) times daily. One po bid x 7 days (Patient not taking: Reported on 01/28/2019) 30 tablet 0    No results found for this or any previous visit (from the past 48 hour(s)). No results found.  ROS  Blood pressure 139/88, pulse 79, temperature 98.3 F (36.8 C), temperature source Oral, resp. rate (!) 23, height 5\' 5"  (1.651 m), weight 90.6 kg, SpO2 99 %. Physical Exam  Constitutional: She appears well-developed and well-nourished.  HENT:  Mouth/Throat: Oropharynx is clear and moist.  Eyes: Conjunctivae are normal. No scleral icterus.  Neck: No thyromegaly present.  Cardiovascular: Normal rate, regular rhythm and normal heart sounds.  No murmur heard. Respiratory: Effort normal and breath sounds normal.  GI:  Abdomen is full.  On palpation is soft.  She has mild tenderness at LUQ.  No guarding or rebound.  No organomegaly or masses.  Musculoskeletal:        General: No edema.  Skin: Skin is warm and dry.     Assessment/Plan History of left-sided colitis. Diagnostic colonoscopy.  Hildred Laser, MD 01/30/2019, 11:31 AM

## 2019-01-30 NOTE — Op Note (Signed)
[facility] Patient Name: Valerie Buck Procedure Date: 01/30/2019 11:16 AM MRN: [id number] Date of Birth: [birth year] Attending MD: Hildred Laser , MD CSN: [id number] Age: 73 Admit Type: Outpatient Procedure:                Colonoscopy Indications:              Abnormal CT of the GI tract, left sided colitis Providers:                Hildred Laser, MD, Otis Peak B. Sharon Seller, RN, Raphael Gibney, Technician Referring MD:             Beola Cord, NP Medicines:                Meperidine 50 mg IV, Midazolam 10 mg IV Complications:            No immediate complications. Estimated Blood Loss:     Estimated blood loss was minimal. Procedure:                Pre-Anesthesia Assessment:                           - Prior to the procedure, a History and Physical                            was performed, and patient medications and                            allergies were reviewed. The patient's tolerance of                            previous anesthesia was also reviewed. The risks                            and benefits of the procedure and the sedation                            options and risks were discussed with the patient.                            All questions were answered, and informed consent                            was obtained. Prior Anticoagulants: The patient has                            taken no previous anticoagulant or antiplatelet                            agents except for NSAID medication. ASA Grade                            Assessment: II - A patient with mild systemic  disease. After reviewing the risks and benefits,                            the patient was deemed in satisfactory condition to                            undergo the procedure.                           After obtaining informed consent, the colonoscope                            was passed under direct vision. Throughout the            procedure, the patient's blood pressure, pulse, and                            oxygen saturations were monitored continuously. The                            [id number]-[id number] scope was introduced through                            the anus and advanced to the the cecum, identified                            by appendiceal orifice and ileocecal valve. The                            colonoscopy was performed without difficulty. The                            patient tolerated the procedure well. The quality                            of the bowel preparation was good. The ileocecal                            valve, appendiceal orifice, and rectum were                            photographed. Scope In: 11:43:12 AM Scope Out: 12:07:26 PM Scope Withdrawal Time: 0 hours 12 minutes 9 seconds  Total Procedure Duration: 0 hours 24 minutes 14 seconds  Findings:      The perianal and digital rectal examinations were normal.      A small polyp was found in the hepatic flexure. The polyp was removed       with a cold snare. Resection and retrieval were complete.      A few diverticula were found in the sigmoid colon.      External hemorrhoids were found during retroflexion. The hemorrhoids       were small.      Anal papilla(e) were hypertrophied. Impression:               - One small polyp at the hepatic flexure, removed  with a cold snare. Resected and retrieved.                           - Diverticulosis in the sigmoid colon.                           - External hemorrhoids.                           - Anal papilla(e) were hypertrophied. Moderate Sedation:      Moderate (conscious) sedation was administered by the endoscopy nurse       and supervised by the endoscopist. The following parameters were       monitored: oxygen saturation, heart rate, blood pressure, CO2       capnography and response to care. Total physician intraservice time was       31  minutes. Recommendation:           - Patient has a contact number available for                            emergencies. The signs and symptoms of potential                            delayed complications were discussed with the                            patient. Return to normal activities tomorrow.                            Written discharge instructions were provided to the                            patient.                           - High fiber diet today.                           - Continue present medications.                           - No aspirin, ibuprofen, naproxen, or other                            non-steroidal anti-inflammatory drugs for 1 day.                           - Await pathology results.                           - Repeat colonoscopy is recommended. The                            colonoscopy date will be determined after pathology  results from today's exam become available for                            review. Procedure Code(s):        --- Professional ---                           [phone], Colonoscopy, flexible; with removal of                            tumor(s), polyp(s), or other lesion(s) by snare                            technique                           99153, Moderate sedation; each additional 15                            minutes intraservice time                           G0500, Moderate sedation services provided by the                            same physician or other qualified health care                            professional performing a gastrointestinal                            endoscopic service that sedation supports,                            requiring the presence of an independent trained                            observer to assist in the monitoring of the                            patient's level of consciousness and physiological                            status; initial 15 minutes of intra-service  time;                            patient age 84 years or older (additional time may                            be reported with [phone], as appropriate) Diagnosis Code(s):        --- Professional ---                           K63.5, Polyp of colon  K64.4, Residual hemorrhoidal skin tags                           K62.89, Other specified diseases of anus and rectum                           K57.30, Diverticulosis of large intestine without                            perforation or abscess without bleeding                           R93.3, Abnormal findings on diagnostic imaging of                            other parts of digestive tract CPT copyright 2019 [employer]. All rights reserved. The codes documented in this report are preliminary and upon coder review may  be revised to meet current compliance requirements. Hildred Laser, MD Hildred Laser, MD 01/30/2019 12:26:29 PM This report has been signed electronically. Number of Addenda: 0

## 2019-02-01 LAB — SURGICAL PATHOLOGY

## 2019-02-06 ENCOUNTER — Encounter (HOSPITAL_COMMUNITY): Payer: Self-pay | Admitting: Internal Medicine

## 2020-11-27 IMAGING — CT CT ABDOMEN AND PELVIS WITHOUT CONTRAST
2 of 4 series · 16 of 46 positions shown, 18 images · non-contrast
Comparison: None.

CLINICAL DATA: Diarrhea and bright red blood per rectum

EXAM:
CT ABDOMEN AND PELVIS WITHOUT CONTRAST
TECHNIQUE: Multidetector CT imaging of the abdomen and pelvis was performed
following the standard protocol without IV contrast.

[Series 2: axial st · axial · 0.96mm/px · z∈[+888,+1278]mm · 13 of 88 slices shown, 15 images]
[im 5/88  soft-tissue]
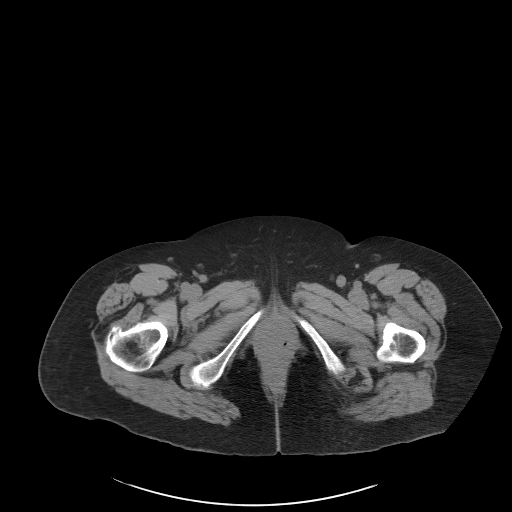
[im 5/88  bone]
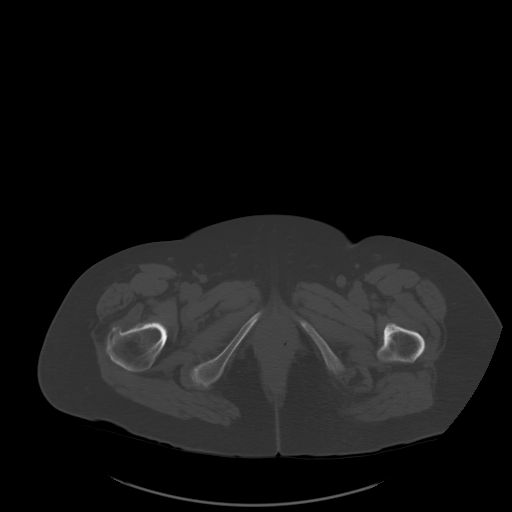
[im 14/88  soft-tissue]
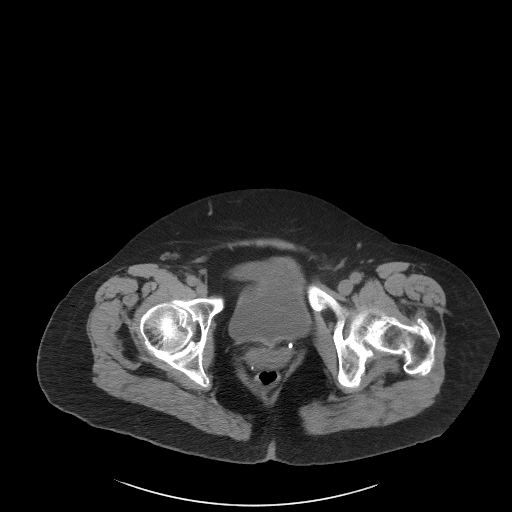
[im 19/88  soft-tissue]
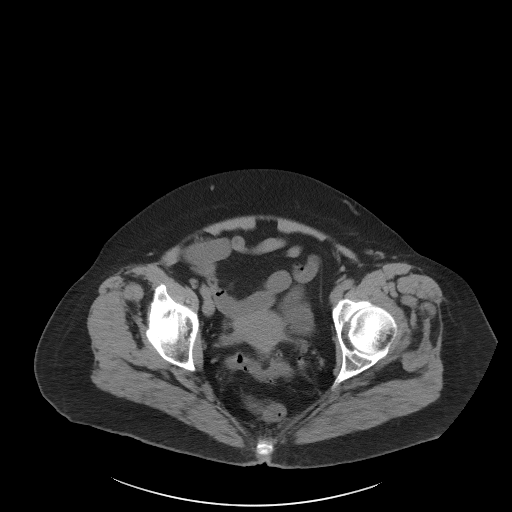
[im 23/88  soft-tissue]
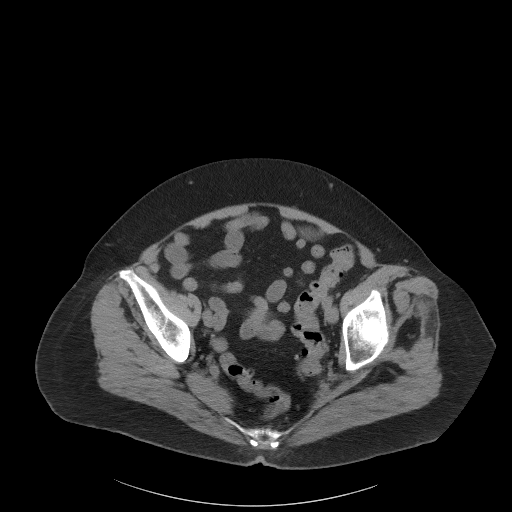
[im 33/88  soft-tissue]
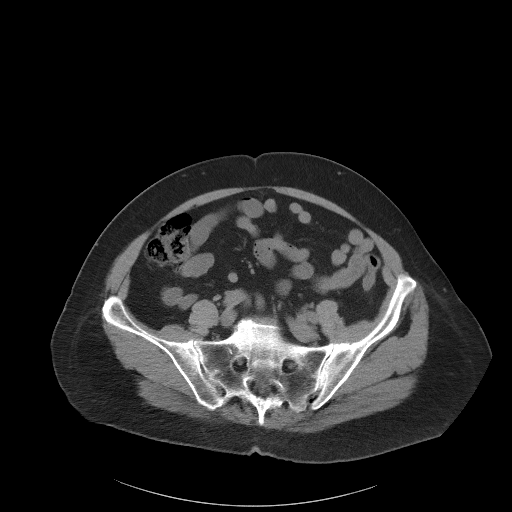
[im 37/88  soft-tissue]
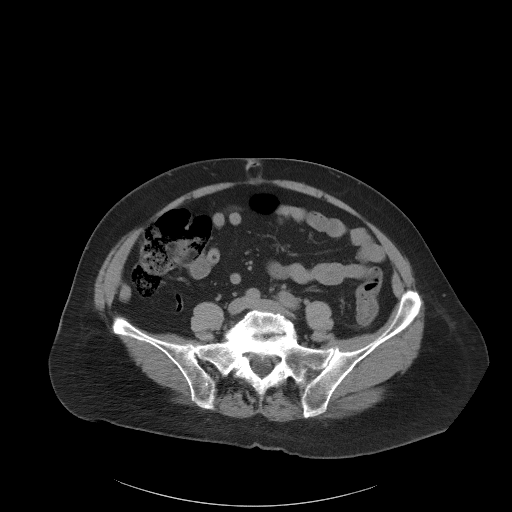
[im 46/88  soft-tissue]
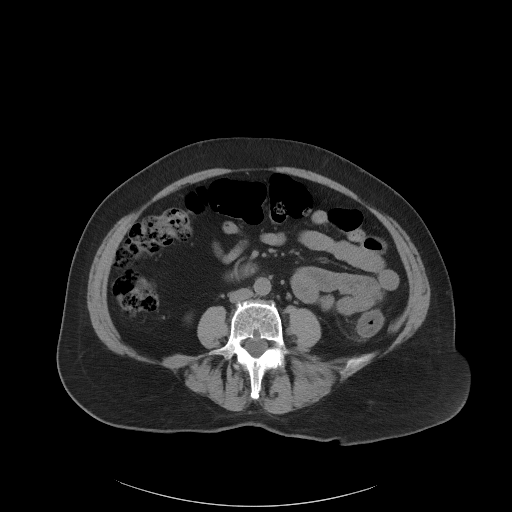
[im 51/88  soft-tissue]
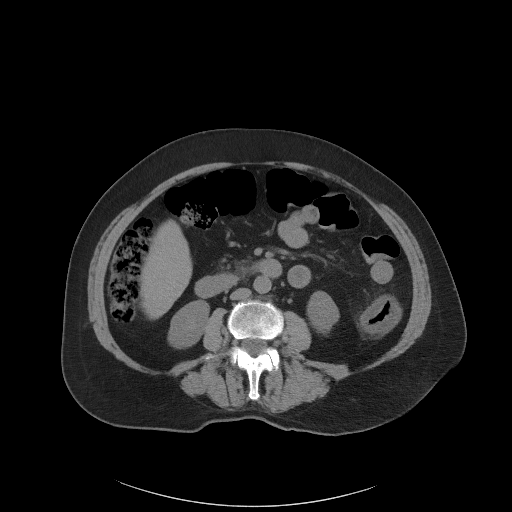
[im 55/88  soft-tissue]
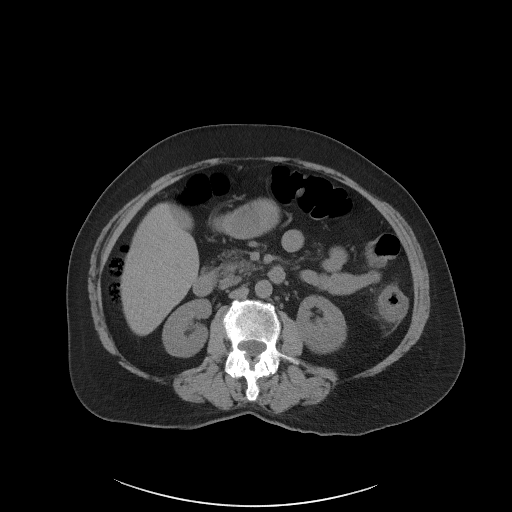
[im 55/88  bone]
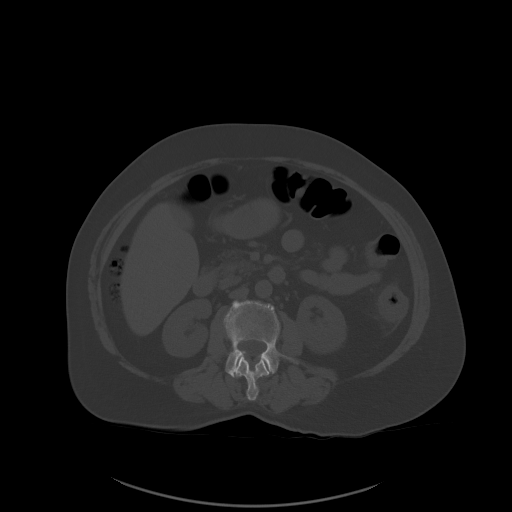
[im 65/88  soft-tissue]
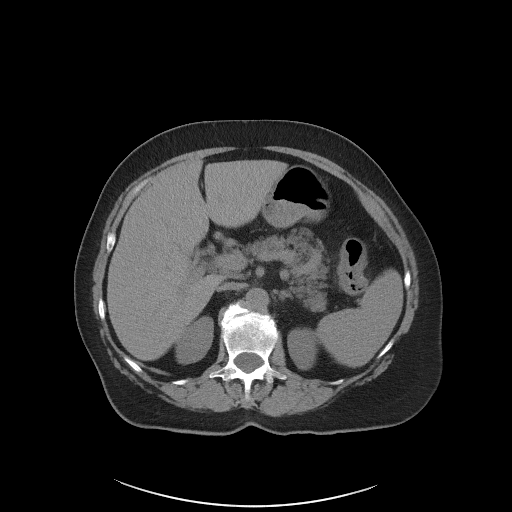
[im 69/88  soft-tissue]
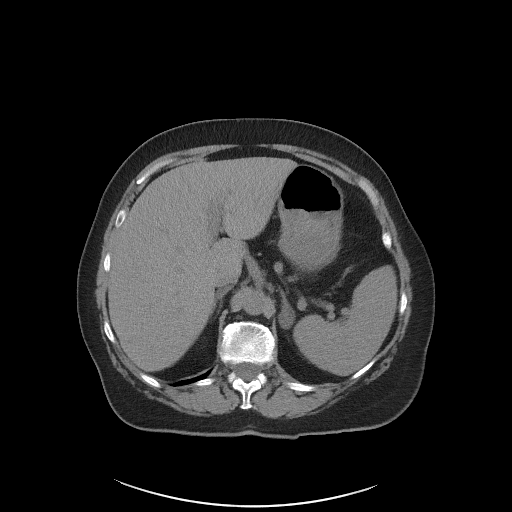
[im 74/88  soft-tissue]
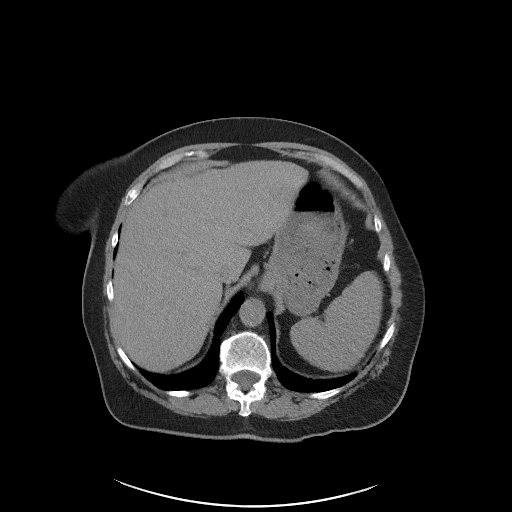
[im 83/88  soft-tissue]
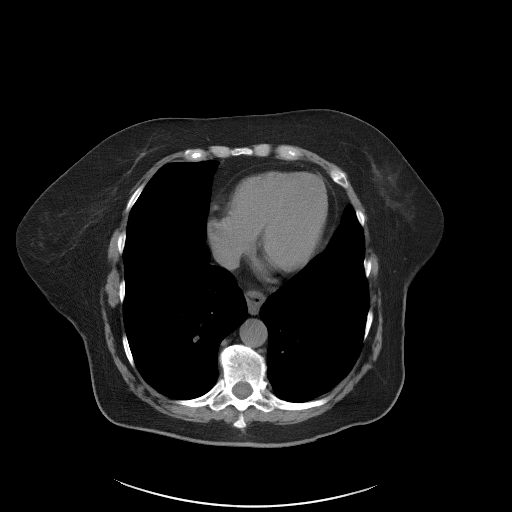

[Series 5: coronal st · coronal · 0.85mm/px · 3 of 96 slices shown]
[im 32/96  soft-tissue]
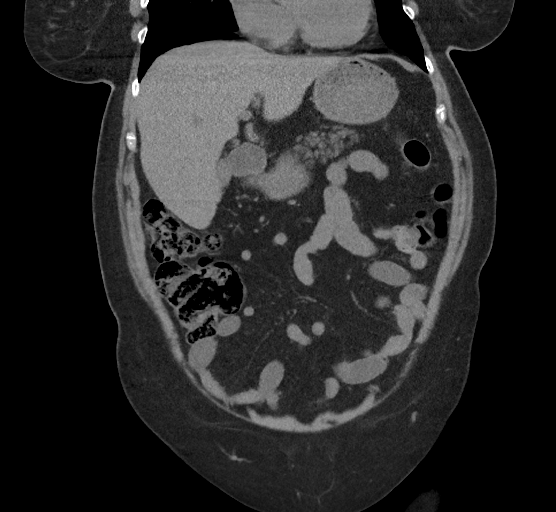
[im 43/96  soft-tissue]
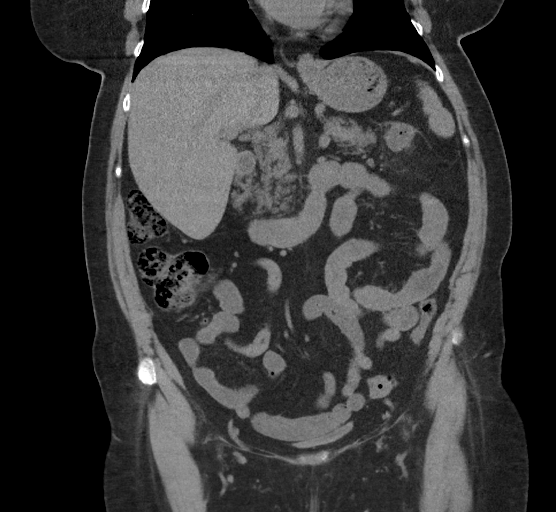
[im 53/96  soft-tissue]
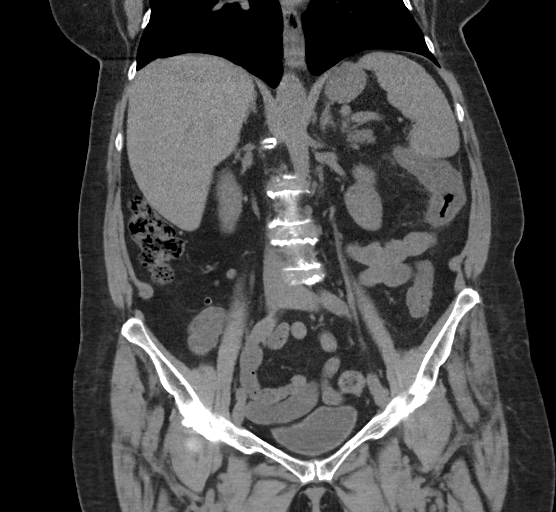

[16 of 46 positions shown; findings below may reference images not displayed]

FINDINGS: LOWER CHEST: 3 mm right lower lobe pulmonary nodule.

HEPATOBILIARY: The hepatic contours and density are normal. There is
no intra- or extrahepatic biliary dilatation. The gallbladder is
normal.

PANCREAS: The pancreatic parenchymal contours are normal and there
is no ductal dilatation. There is no peripancreatic fluid
collection.

SPLEEN: Normal.

ADRENALS/URINARY TRACT:

--Adrenal glands: There is a left adrenal lesion measuring 16 mm
with an attenuation of 10 HU.

--Right kidney/ureter: No hydronephrosis, nephroureterolithiasis,
perinephric stranding or solid renal mass.

--Left kidney/ureter: No hydronephrosis, nephroureterolithiasis,
perinephric stranding or solid renal mass.

--Urinary bladder: Normal for degree of distention

STOMACH/BOWEL:

--Stomach/Duodenum: There is wall thickening and adjacent
inflammatory stranding of the descending colon, beginning at the
splenic flexure. No free intraperitoneal air or fluid collection.
The remainder of the colon is normal.

--Small bowel: No dilatation or inflammation.

--Colon: No focal abnormality.

--Appendix: Normal.

VASCULAR/LYMPHATIC: Normal course and caliber of the major abdominal
vessels. No abdominal or pelvic lymphadenopathy.

REPRODUCTIVE: Normal uterus and ovaries.

MUSCULOSKELETAL. No bony spinal canal stenosis or focal osseous
abnormality.

OTHER: None.
IMPRESSION: 1. Acute colitis of the descending colon without free
intraperitoneal air or intraperitoneal fluid collection.
2. Left adrenal adenoma.
